# Patient Record
Sex: Male | Born: 1995 | Hispanic: No | Marital: Single | Smoking: Never smoker
Health system: Southern US, Community
[De-identification: ages and names within clinical notes are randomized; demographics above are authoritative.]

## PROBLEM LIST (undated history)

## (undated) HISTORY — PX: ELBOW SURGERY: SHX618

---
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
## 2017-01-17 DIAGNOSIS — M7651 Patellar tendinitis, right knee: Secondary | ICD-10-CM | POA: Diagnosis not present

## 2017-01-31 DIAGNOSIS — M7651 Patellar tendinitis, right knee: Secondary | ICD-10-CM | POA: Diagnosis not present

## 2017-02-01 DIAGNOSIS — M94261 Chondromalacia, right knee: Secondary | ICD-10-CM | POA: Diagnosis not present

## 2017-02-01 DIAGNOSIS — M7651 Patellar tendinitis, right knee: Secondary | ICD-10-CM | POA: Diagnosis not present

## 2017-02-01 DIAGNOSIS — Z79899 Other long term (current) drug therapy: Secondary | ICD-10-CM | POA: Diagnosis not present

## 2017-02-09 DIAGNOSIS — M2391 Unspecified internal derangement of right knee: Secondary | ICD-10-CM | POA: Diagnosis not present

## 2017-03-16 DIAGNOSIS — J029 Acute pharyngitis, unspecified: Secondary | ICD-10-CM | POA: Diagnosis not present

## 2017-03-30 IMAGING — CR DG ANKLE COMPLETE 3+V*R*
1 series · 3 of 3 positions shown · non-contrast
Comparison: 02/21/2014

CLINICAL DATA: Fall.  Pain and swelling laterally

EXAM:
RIGHT ANKLE - COMPLETE 3+ VIEW

[Series 1: ap · 0.17mm/px · 3 of 3 slices shown]
[im 1/3]
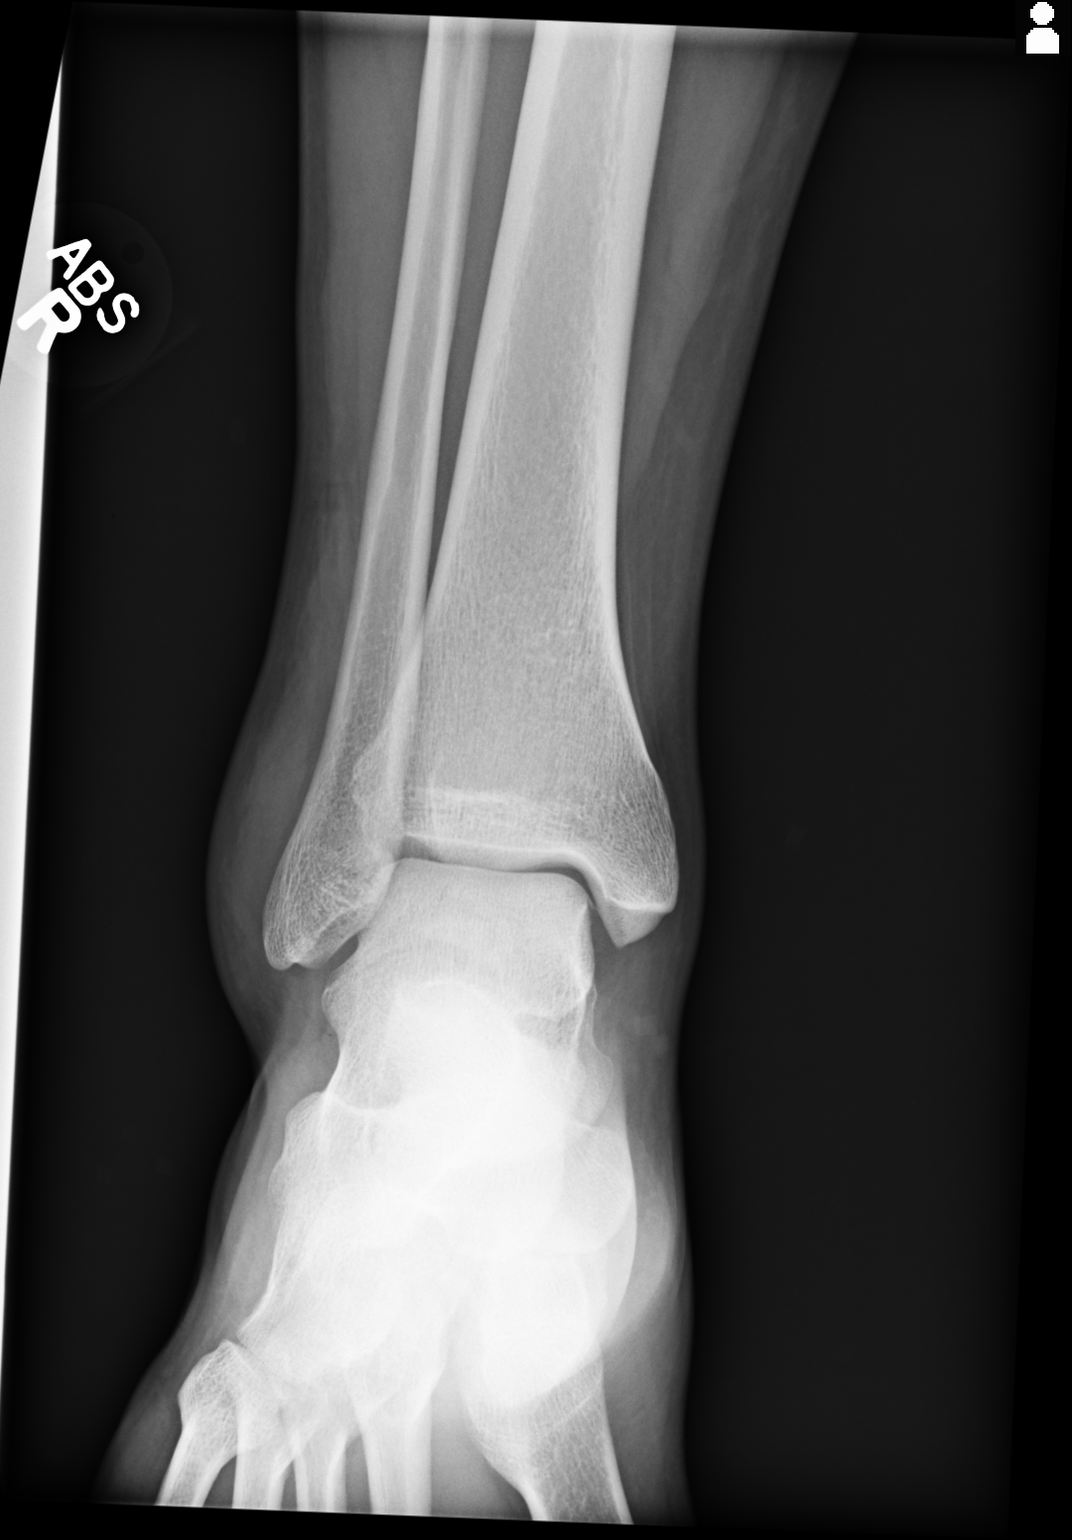
[im 2/3]
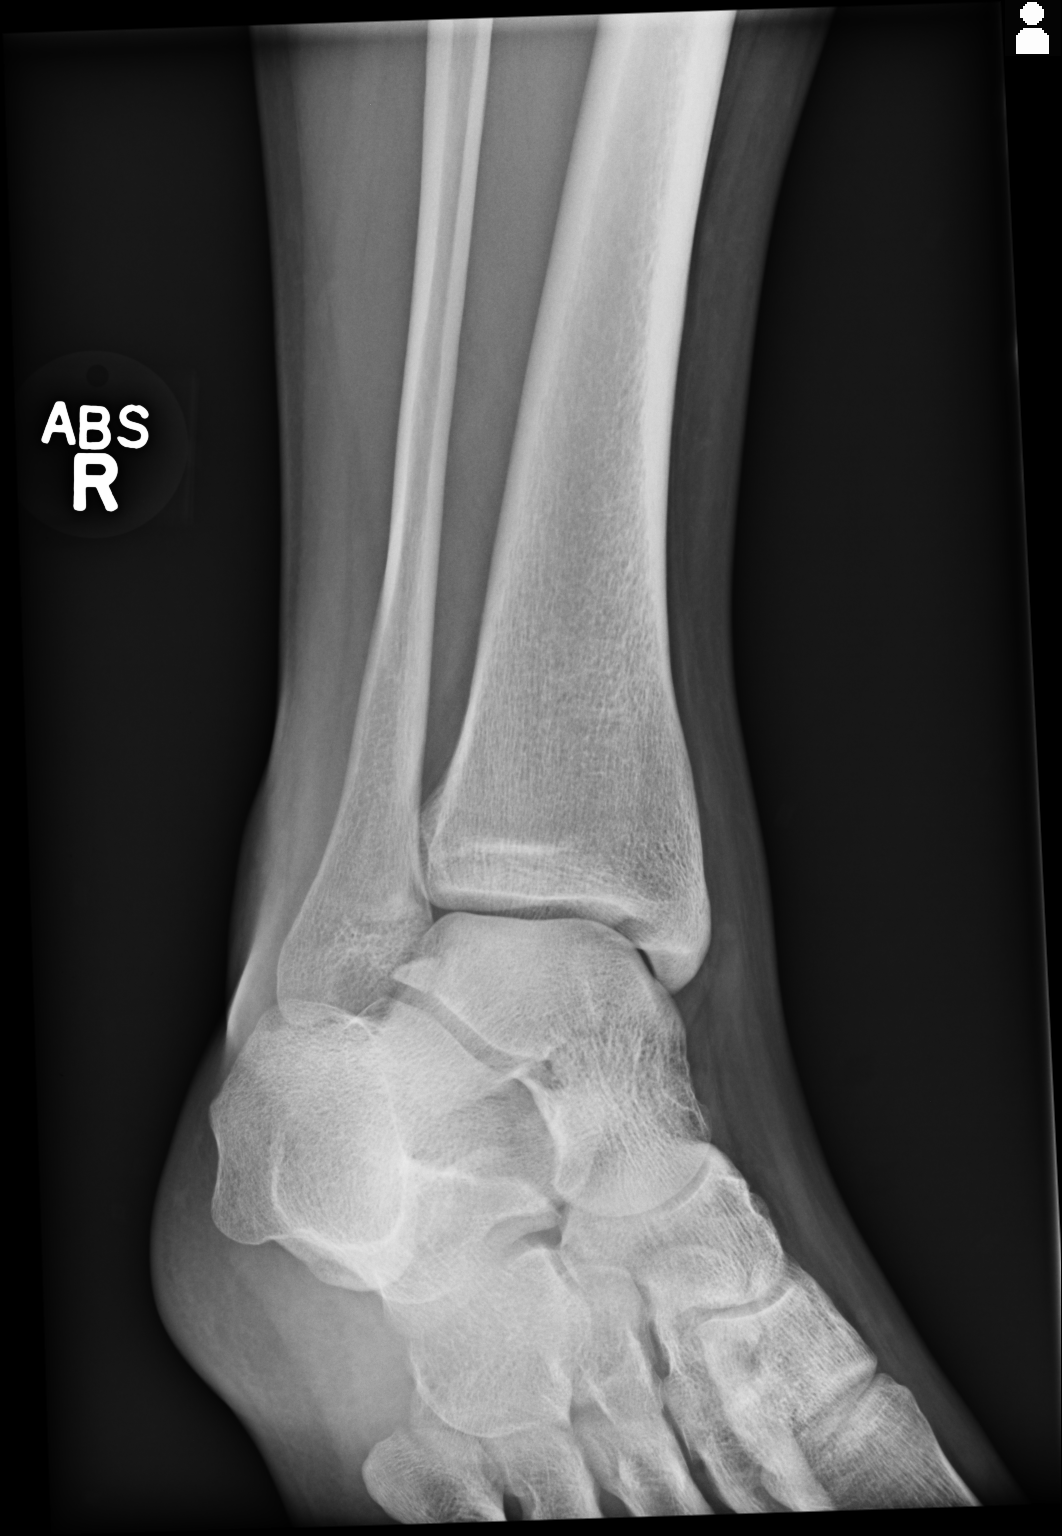
[im 3/3]
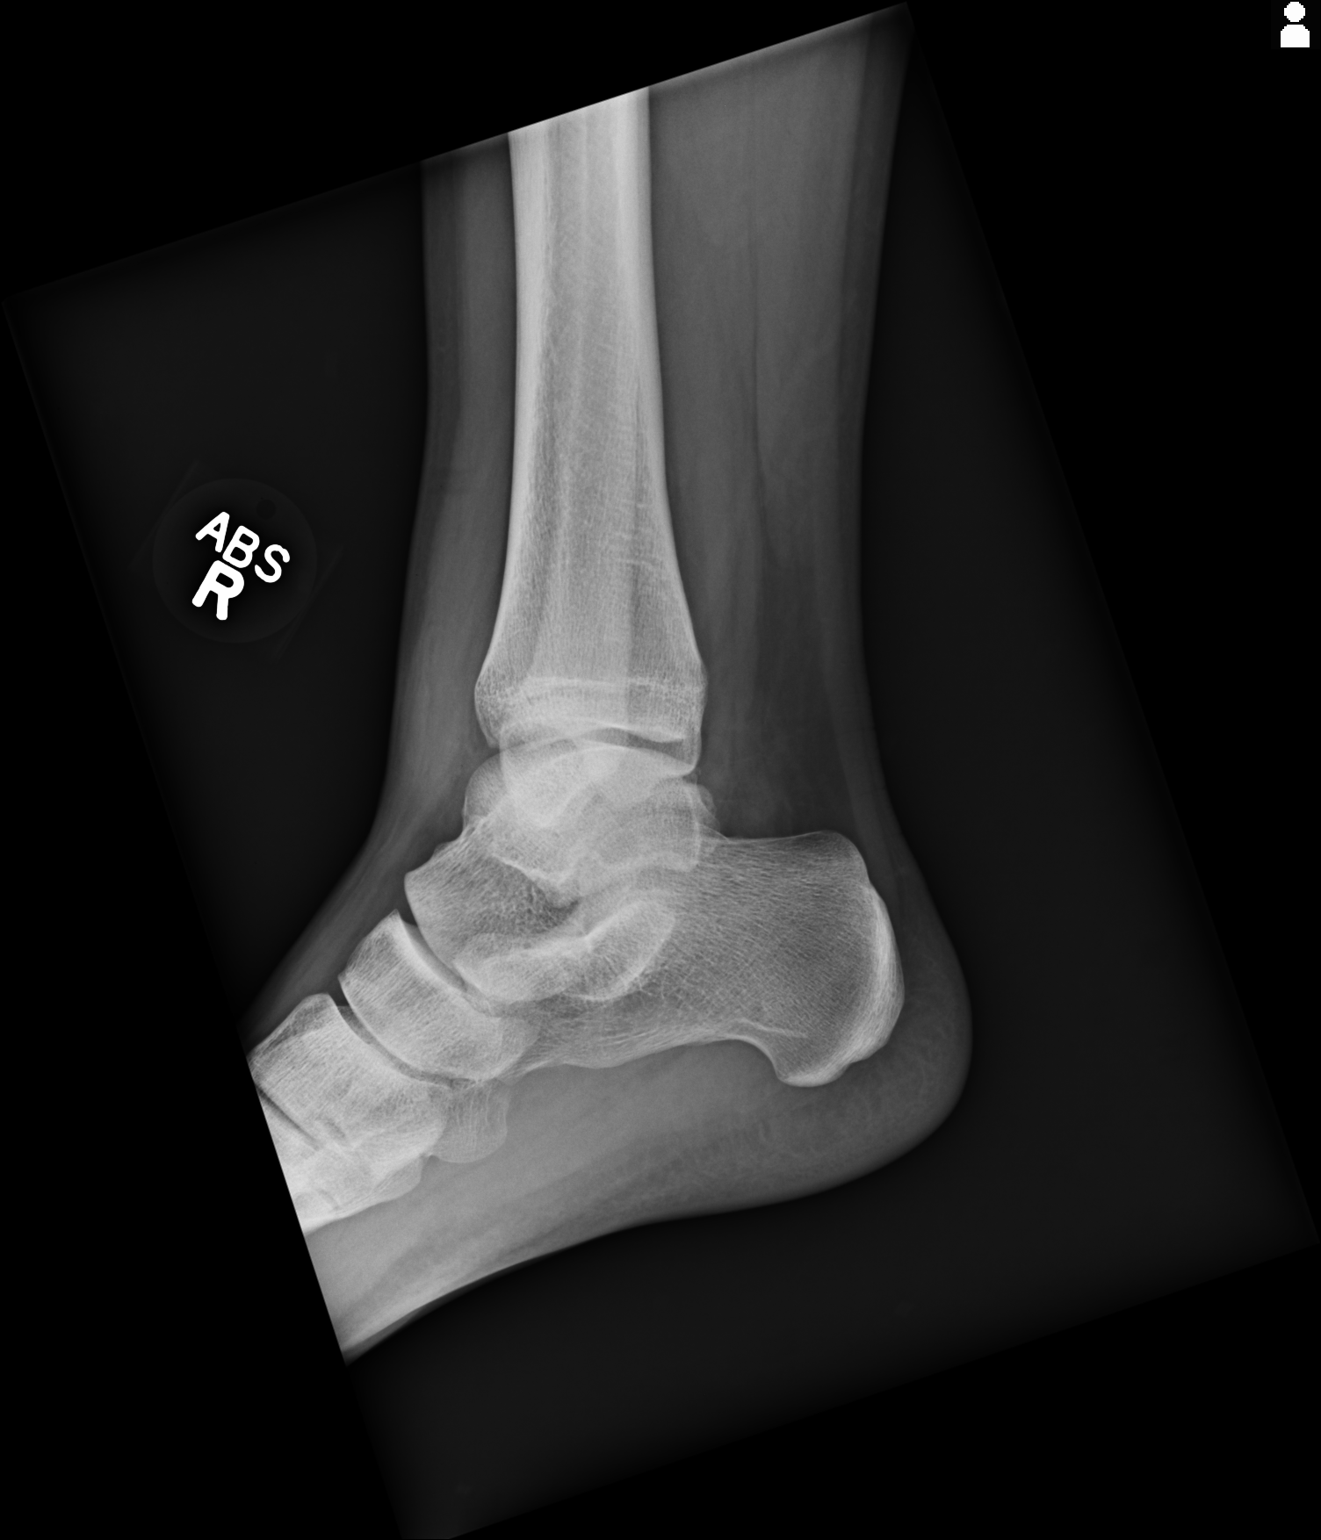

[3 of 3 positions shown; findings below may reference images not displayed]

FINDINGS: Negative for fracture. Joint space is normal. There is lateral soft
tissue swelling without joint effusion.
IMPRESSION: Lateral soft tissue swelling.  Negative for fracture.

## 2017-05-02 DIAGNOSIS — M25561 Pain in right knee: Secondary | ICD-10-CM | POA: Diagnosis not present

## 2017-05-03 DIAGNOSIS — M25561 Pain in right knee: Secondary | ICD-10-CM | POA: Diagnosis not present

## 2017-05-04 DIAGNOSIS — M25561 Pain in right knee: Secondary | ICD-10-CM | POA: Diagnosis not present

## 2017-05-07 DIAGNOSIS — M25561 Pain in right knee: Secondary | ICD-10-CM | POA: Diagnosis not present

## 2017-05-09 DIAGNOSIS — M7651 Patellar tendinitis, right knee: Secondary | ICD-10-CM | POA: Diagnosis not present

## 2017-05-10 DIAGNOSIS — M25561 Pain in right knee: Secondary | ICD-10-CM | POA: Diagnosis not present

## 2017-05-11 DIAGNOSIS — M25561 Pain in right knee: Secondary | ICD-10-CM | POA: Diagnosis not present

## 2017-05-14 DIAGNOSIS — M25561 Pain in right knee: Secondary | ICD-10-CM | POA: Diagnosis not present

## 2017-05-14 DIAGNOSIS — M7651 Patellar tendinitis, right knee: Secondary | ICD-10-CM | POA: Diagnosis not present

## 2017-05-14 DIAGNOSIS — Z23 Encounter for immunization: Secondary | ICD-10-CM | POA: Diagnosis not present

## 2017-05-15 DIAGNOSIS — M25561 Pain in right knee: Secondary | ICD-10-CM | POA: Diagnosis not present

## 2017-05-17 DIAGNOSIS — M25561 Pain in right knee: Secondary | ICD-10-CM | POA: Diagnosis not present

## 2017-08-21 DIAGNOSIS — Y33XXXA Other specified events, undetermined intent, initial encounter: Secondary | ICD-10-CM | POA: Diagnosis not present

## 2017-08-21 DIAGNOSIS — S93492A Sprain of other ligament of left ankle, initial encounter: Secondary | ICD-10-CM | POA: Diagnosis not present

## 2017-09-25 DIAGNOSIS — S86312D Strain of muscle(s) and tendon(s) of peroneal muscle group at lower leg level, left leg, subsequent encounter: Secondary | ICD-10-CM | POA: Diagnosis not present

## 2017-09-25 DIAGNOSIS — M7989 Other specified soft tissue disorders: Secondary | ICD-10-CM | POA: Diagnosis not present

## 2017-09-25 DIAGNOSIS — M2142 Flat foot [pes planus] (acquired), left foot: Secondary | ICD-10-CM | POA: Diagnosis not present

## 2017-10-05 DIAGNOSIS — Y33XXXA Other specified events, undetermined intent, initial encounter: Secondary | ICD-10-CM | POA: Diagnosis not present

## 2017-10-05 DIAGNOSIS — S93492D Sprain of other ligament of left ankle, subsequent encounter: Secondary | ICD-10-CM | POA: Diagnosis not present

## 2017-10-05 DIAGNOSIS — S93412A Sprain of calcaneofibular ligament of left ankle, initial encounter: Secondary | ICD-10-CM | POA: Diagnosis not present

## 2017-10-05 DIAGNOSIS — S93412D Sprain of calcaneofibular ligament of left ankle, subsequent encounter: Secondary | ICD-10-CM | POA: Diagnosis not present

## 2017-10-05 DIAGNOSIS — R609 Edema, unspecified: Secondary | ICD-10-CM | POA: Diagnosis not present

## 2017-10-05 DIAGNOSIS — S86312D Strain of muscle(s) and tendon(s) of peroneal muscle group at lower leg level, left leg, subsequent encounter: Secondary | ICD-10-CM | POA: Diagnosis not present

## 2017-10-18 DIAGNOSIS — S93492D Sprain of other ligament of left ankle, subsequent encounter: Secondary | ICD-10-CM | POA: Diagnosis not present

## 2017-10-18 DIAGNOSIS — S9032XD Contusion of left foot, subsequent encounter: Secondary | ICD-10-CM | POA: Diagnosis not present

## 2017-11-27 DIAGNOSIS — S93492D Sprain of other ligament of left ankle, subsequent encounter: Secondary | ICD-10-CM | POA: Diagnosis not present

## 2017-12-04 DIAGNOSIS — M25572 Pain in left ankle and joints of left foot: Secondary | ICD-10-CM | POA: Diagnosis not present

## 2017-12-04 DIAGNOSIS — M25372 Other instability, left ankle: Secondary | ICD-10-CM | POA: Diagnosis not present

## 2017-12-06 DIAGNOSIS — M25372 Other instability, left ankle: Secondary | ICD-10-CM | POA: Diagnosis not present

## 2017-12-06 DIAGNOSIS — M25572 Pain in left ankle and joints of left foot: Secondary | ICD-10-CM | POA: Diagnosis not present

## 2017-12-07 DIAGNOSIS — M25372 Other instability, left ankle: Secondary | ICD-10-CM | POA: Diagnosis not present

## 2017-12-07 DIAGNOSIS — M25572 Pain in left ankle and joints of left foot: Secondary | ICD-10-CM | POA: Diagnosis not present

## 2017-12-11 DIAGNOSIS — M25572 Pain in left ankle and joints of left foot: Secondary | ICD-10-CM | POA: Diagnosis not present

## 2017-12-11 DIAGNOSIS — M25372 Other instability, left ankle: Secondary | ICD-10-CM | POA: Diagnosis not present

## 2017-12-13 DIAGNOSIS — M25372 Other instability, left ankle: Secondary | ICD-10-CM | POA: Diagnosis not present

## 2017-12-13 DIAGNOSIS — M25572 Pain in left ankle and joints of left foot: Secondary | ICD-10-CM | POA: Diagnosis not present

## 2017-12-18 DIAGNOSIS — M25372 Other instability, left ankle: Secondary | ICD-10-CM | POA: Diagnosis not present

## 2017-12-18 DIAGNOSIS — M25572 Pain in left ankle and joints of left foot: Secondary | ICD-10-CM | POA: Diagnosis not present

## 2017-12-19 DIAGNOSIS — M25372 Other instability, left ankle: Secondary | ICD-10-CM | POA: Diagnosis not present

## 2017-12-19 DIAGNOSIS — M25572 Pain in left ankle and joints of left foot: Secondary | ICD-10-CM | POA: Diagnosis not present

## 2017-12-24 DIAGNOSIS — M25372 Other instability, left ankle: Secondary | ICD-10-CM | POA: Diagnosis not present

## 2017-12-24 DIAGNOSIS — M25572 Pain in left ankle and joints of left foot: Secondary | ICD-10-CM | POA: Diagnosis not present

## 2017-12-26 DIAGNOSIS — M25572 Pain in left ankle and joints of left foot: Secondary | ICD-10-CM | POA: Diagnosis not present

## 2017-12-26 DIAGNOSIS — M25372 Other instability, left ankle: Secondary | ICD-10-CM | POA: Diagnosis not present

## 2017-12-31 DIAGNOSIS — M25572 Pain in left ankle and joints of left foot: Secondary | ICD-10-CM | POA: Diagnosis not present

## 2017-12-31 DIAGNOSIS — M25372 Other instability, left ankle: Secondary | ICD-10-CM | POA: Diagnosis not present

## 2018-01-04 DIAGNOSIS — Z91018 Allergy to other foods: Secondary | ICD-10-CM | POA: Diagnosis not present

## 2018-01-04 DIAGNOSIS — M25572 Pain in left ankle and joints of left foot: Secondary | ICD-10-CM | POA: Diagnosis not present

## 2018-01-04 DIAGNOSIS — M25372 Other instability, left ankle: Secondary | ICD-10-CM | POA: Diagnosis not present

## 2018-01-09 DIAGNOSIS — M25572 Pain in left ankle and joints of left foot: Secondary | ICD-10-CM | POA: Diagnosis not present

## 2018-01-09 DIAGNOSIS — M25372 Other instability, left ankle: Secondary | ICD-10-CM | POA: Diagnosis not present

## 2018-01-11 DIAGNOSIS — M25572 Pain in left ankle and joints of left foot: Secondary | ICD-10-CM | POA: Diagnosis not present

## 2018-01-11 DIAGNOSIS — M25372 Other instability, left ankle: Secondary | ICD-10-CM | POA: Diagnosis not present

## 2018-07-19 DIAGNOSIS — Z6829 Body mass index (BMI) 29.0-29.9, adult: Secondary | ICD-10-CM | POA: Diagnosis not present

## 2019-05-15 ENCOUNTER — Ambulatory Visit: Payer: BC Managed Care – PPO | Attending: Internal Medicine

## 2019-05-15 DIAGNOSIS — Z20822 Contact with and (suspected) exposure to covid-19: Secondary | ICD-10-CM

## 2019-05-17 LAB — NOVEL CORONAVIRUS, NAA: SARS-CoV-2, NAA: NOT DETECTED

## 2019-05-20 ENCOUNTER — Ambulatory Visit: Payer: BC Managed Care – PPO | Attending: Internal Medicine

## 2019-05-20 DIAGNOSIS — Z20822 Contact with and (suspected) exposure to covid-19: Secondary | ICD-10-CM

## 2019-05-22 LAB — NOVEL CORONAVIRUS, NAA: SARS-CoV-2, NAA: NOT DETECTED
